# Patient Record
Sex: Female | Born: 1955 | Hispanic: Refuse to answer | Marital: Single
Health system: Southern US, Community
[De-identification: ages and names within clinical notes are randomized; demographics above are authoritative.]

---

## 2004-03-26 ENCOUNTER — Ambulatory Visit: Payer: Self-pay | Admitting: Family Medicine

## 2004-06-03 ENCOUNTER — Ambulatory Visit: Payer: Self-pay | Admitting: Family Medicine

## 2004-08-26 ENCOUNTER — Encounter: Payer: Self-pay | Admitting: Orthopaedic Surgery

## 2004-10-07 ENCOUNTER — Ambulatory Visit: Payer: Self-pay | Admitting: Neurosurgery

## 2004-11-12 ENCOUNTER — Ambulatory Visit: Payer: Self-pay | Admitting: Family Medicine

## 2004-11-20 ENCOUNTER — Ambulatory Visit: Payer: Self-pay | Admitting: Family Medicine

## 2005-02-06 ENCOUNTER — Ambulatory Visit: Payer: Self-pay

## 2007-01-10 ENCOUNTER — Ambulatory Visit: Payer: Self-pay | Admitting: Internal Medicine

## 2007-01-13 ENCOUNTER — Emergency Department: Payer: Self-pay | Admitting: Unknown Physician Specialty

## 2007-01-17 ENCOUNTER — Ambulatory Visit: Payer: Self-pay | Admitting: Internal Medicine

## 2007-01-21 ENCOUNTER — Ambulatory Visit: Payer: Self-pay | Admitting: Internal Medicine

## 2007-02-09 ENCOUNTER — Ambulatory Visit: Payer: Self-pay | Admitting: Unknown Physician Specialty

## 2007-02-09 ENCOUNTER — Ambulatory Visit: Payer: Self-pay | Admitting: Internal Medicine

## 2007-02-24 ENCOUNTER — Ambulatory Visit: Payer: Self-pay | Admitting: Unknown Physician Specialty

## 2007-02-25 ENCOUNTER — Inpatient Hospital Stay: Payer: Self-pay | Admitting: Surgery

## 2007-03-12 ENCOUNTER — Ambulatory Visit: Payer: Self-pay | Admitting: Internal Medicine

## 2007-03-15 ENCOUNTER — Ambulatory Visit: Payer: Self-pay | Admitting: Internal Medicine

## 2007-04-11 ENCOUNTER — Ambulatory Visit: Payer: Self-pay | Admitting: Internal Medicine

## 2007-06-07 ENCOUNTER — Ambulatory Visit: Payer: Self-pay | Admitting: Physician Assistant

## 2007-08-10 ENCOUNTER — Ambulatory Visit: Payer: Self-pay | Admitting: Internal Medicine

## 2007-08-18 ENCOUNTER — Ambulatory Visit: Payer: Self-pay | Admitting: Internal Medicine

## 2007-09-09 ENCOUNTER — Ambulatory Visit: Payer: Self-pay | Admitting: Internal Medicine

## 2007-09-29 ENCOUNTER — Ambulatory Visit: Payer: Self-pay | Admitting: Family Medicine

## 2008-03-28 ENCOUNTER — Ambulatory Visit: Payer: Self-pay

## 2008-07-28 IMAGING — PT NM PET TUM IMG INITIAL (PI) SKULL BASE T - THIGH
1 of 3 series · 1 of 25 positions shown · non-contrast
Comparison: none

REASON FOR EXAM: abdominal mass   hx Timur
COMMENTS:

PROCEDURE:     PET - PET/CT INIT STAGING LYMPHOMA  - January 21, 2007 [DATE]
RESULT:
HISTORY: Abdominal mass, Hodgkin's, recent abnormal CT.
PROCEDURE AND FINDINGS: Following determination of a fasting blood sugar of
95 mg/dl, 14.8 mCi of F-18/FDG was administered to the patient and whole
body PET CT was obtained.  No PET positive abnormalities are identified.

[Series 3: ct wb fusion · axial · 4.0mm · 0.98mm/px · 1 of 430 slices shown]
[im 430/430  brain]
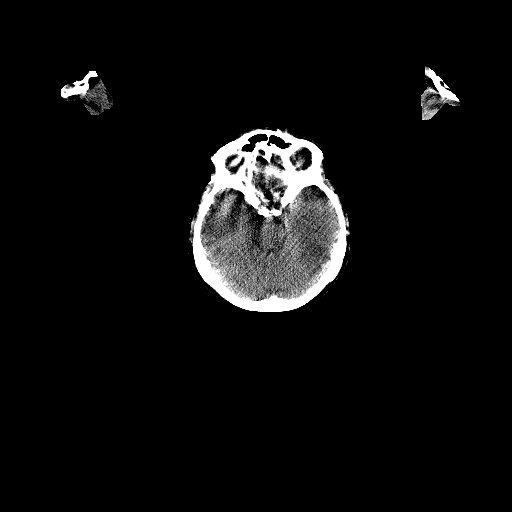

[1 of 25 positions shown; findings below may reference images not displayed]

IMPRESSION: 1)Normal exam. No PET positive abnormality is identified. Specifically, the
previously identified retroperitoneal mass is PET negative.

## 2008-11-08 ENCOUNTER — Ambulatory Visit: Payer: Self-pay | Admitting: Family Medicine

## 2008-11-21 ENCOUNTER — Ambulatory Visit: Payer: Self-pay | Admitting: Family Medicine

## 2009-03-21 ENCOUNTER — Ambulatory Visit: Payer: Self-pay | Admitting: Family Medicine

## 2009-06-05 ENCOUNTER — Ambulatory Visit: Payer: Self-pay | Admitting: Family Medicine

## 2009-12-27 ENCOUNTER — Ambulatory Visit: Payer: Self-pay | Admitting: Cardiovascular Disease

## 2009-12-27 ENCOUNTER — Observation Stay: Payer: Self-pay | Admitting: *Deleted

## 2010-01-03 ENCOUNTER — Ambulatory Visit: Payer: Self-pay | Admitting: Cardiovascular Disease

## 2010-04-29 ENCOUNTER — Ambulatory Visit: Payer: Self-pay | Admitting: Family Medicine

## 2010-06-02 ENCOUNTER — Ambulatory Visit: Payer: Self-pay | Admitting: Family Medicine

## 2012-08-20 ENCOUNTER — Emergency Department: Payer: Self-pay | Admitting: Emergency Medicine

## 2012-08-20 LAB — CBC
HCT: 49 % — ABNORMAL HIGH (ref 35.0–47.0)
HGB: 16.1 g/dL — ABNORMAL HIGH (ref 12.0–16.0)
MCHC: 32.8 g/dL (ref 32.0–36.0)
MCV: 95 fL (ref 80–100)
Platelet: 334 10*3/uL (ref 150–440)
RBC: 5.17 10*6/uL (ref 3.80–5.20)
RDW: 13.3 % (ref 11.5–14.5)
WBC: 19.2 10*3/uL — ABNORMAL HIGH (ref 3.6–11.0)

## 2012-08-20 LAB — BASIC METABOLIC PANEL
Anion Gap: 6 — ABNORMAL LOW (ref 7–16)
BUN: 20 mg/dL — ABNORMAL HIGH (ref 7–18)
Chloride: 100 mmol/L (ref 98–107)
Co2: 28 mmol/L (ref 21–32)
Creatinine: 0.97 mg/dL (ref 0.60–1.30)
EGFR (African American): >60
Potassium: 3.9 mmol/L (ref 3.5–5.1)
Sodium: 134 mmol/L — ABNORMAL LOW (ref 136–145)

## 2012-09-05 ENCOUNTER — Ambulatory Visit: Payer: Self-pay | Admitting: Internal Medicine

## 2012-11-24 ENCOUNTER — Emergency Department: Payer: Self-pay

## 2012-11-24 LAB — CBC
HCT: 28.6 % — ABNORMAL LOW (ref 35.0–47.0)
HGB: 9.3 g/dL — ABNORMAL LOW (ref 12.0–16.0)
Platelet: 382 10*3/uL (ref 150–440)

## 2012-11-24 LAB — COMPREHENSIVE METABOLIC PANEL
Albumin: 2.9 g/dL — ABNORMAL LOW (ref 3.4–5.0)
Alkaline Phosphatase: 201 U/L — ABNORMAL HIGH (ref 50–136)
Bilirubin,Total: 0.5 mg/dL (ref 0.2–1.0)
Chloride: 107 mmol/L (ref 98–107)
Co2: 30 mmol/L (ref 21–32)
Creatinine: 0.83 mg/dL (ref 0.60–1.30)
EGFR (African American): >60
Sodium: 143 mmol/L (ref 136–145)
Total Protein: 6.1 g/dL — ABNORMAL LOW (ref 6.4–8.2)

## 2014-01-09 DEATH — deceased

## 2014-06-01 IMAGING — CT CT CERVICAL SPINE WITHOUT CONTRAST
1 series · 12 of 14 positions shown, 15 images · non-contrast
Comparison: none

REASON FOR EXAM: fall; unable to clear cspine
COMMENTS:

[Series 5: axial · axial · 0.20mm/px · z∈[-238,-85]mm · 12 of 93 slices shown, 15 images]
[im 8/93  soft-tissue]
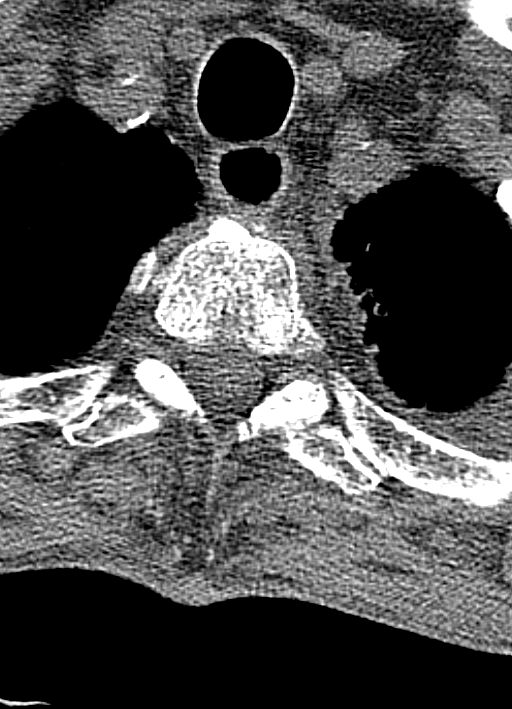
[im 8/93  bone]
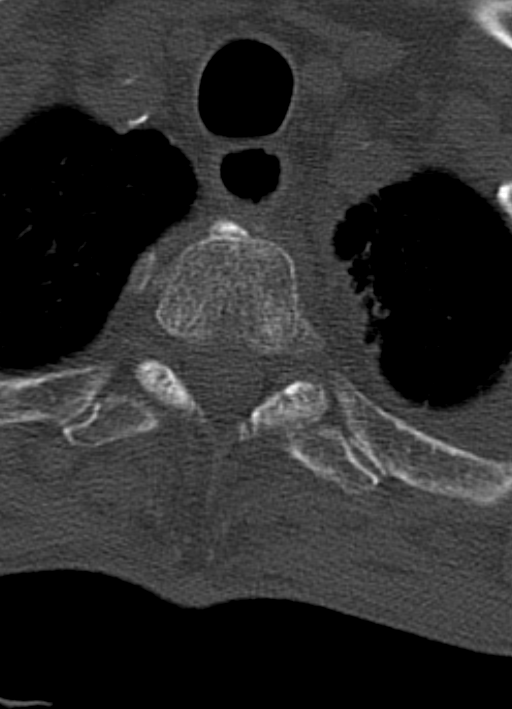
[im 15/93  bone]
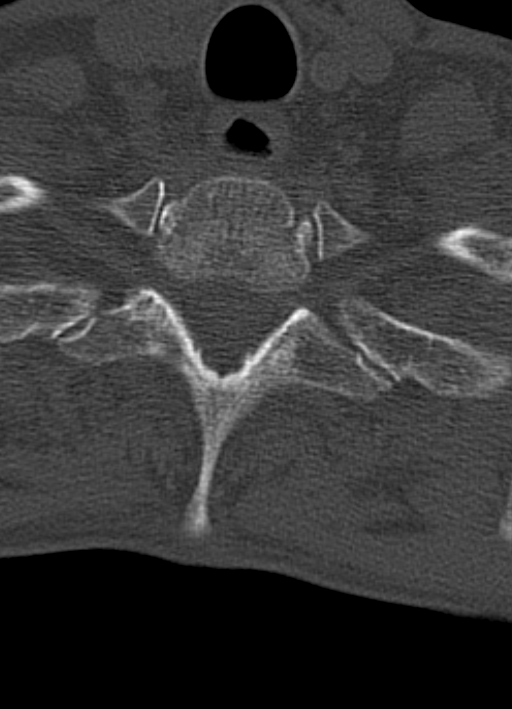
[im 22/93  bone]
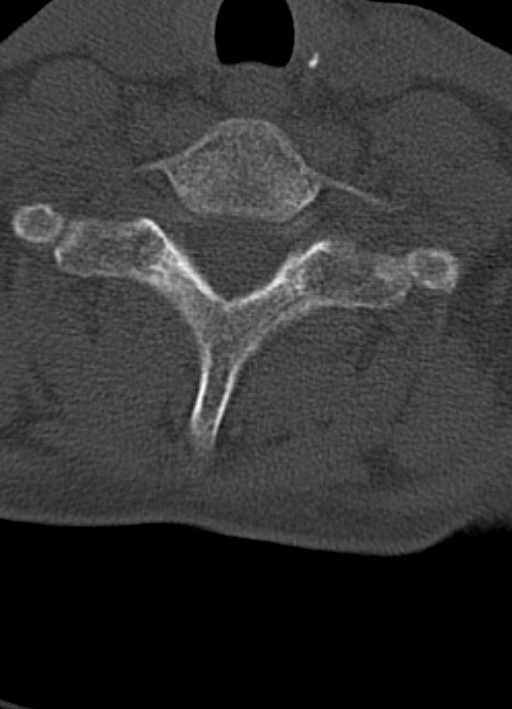
[im 29/93  bone]
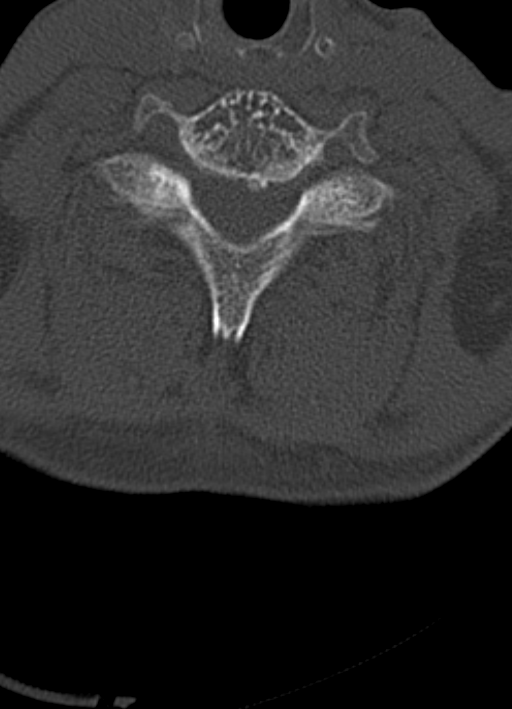
[im 36/93  soft-tissue]
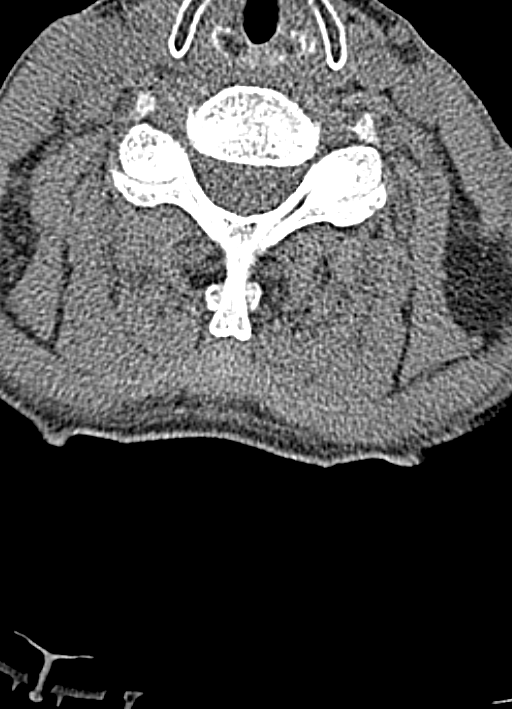
[im 36/93  bone]
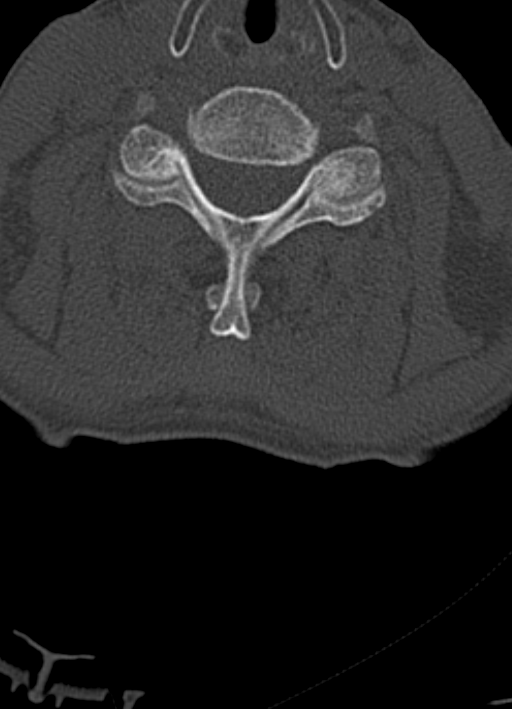
[im 43/93  bone]
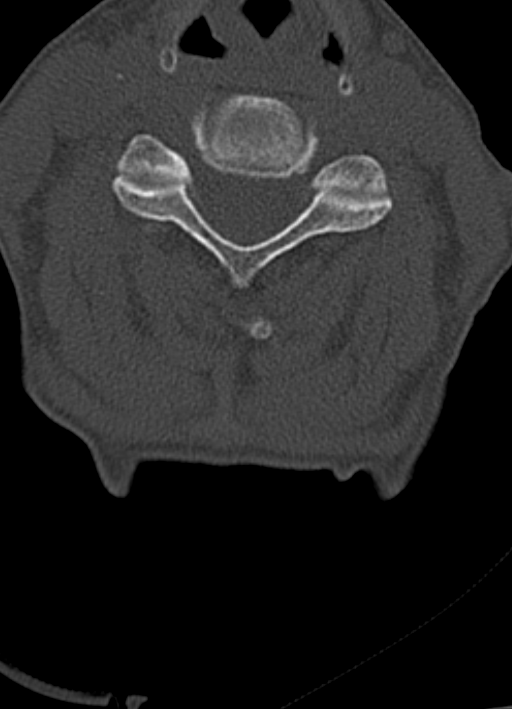
[im 50/93  bone]
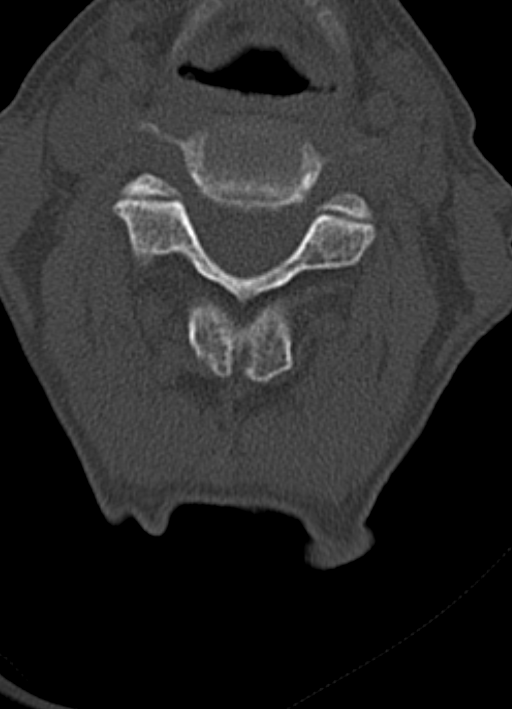
[im 57/93  bone]
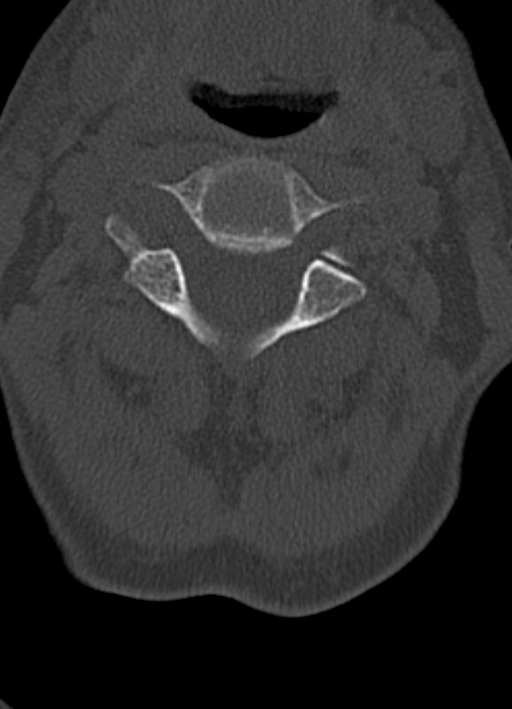
[im 64/93  soft-tissue]
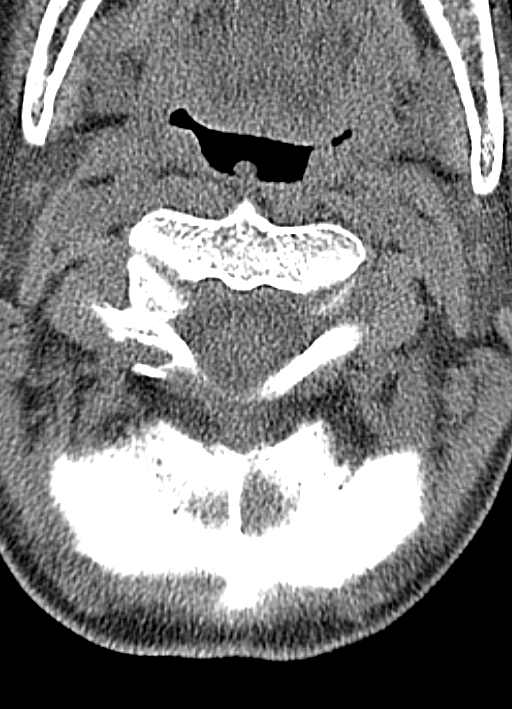
[im 64/93  bone]
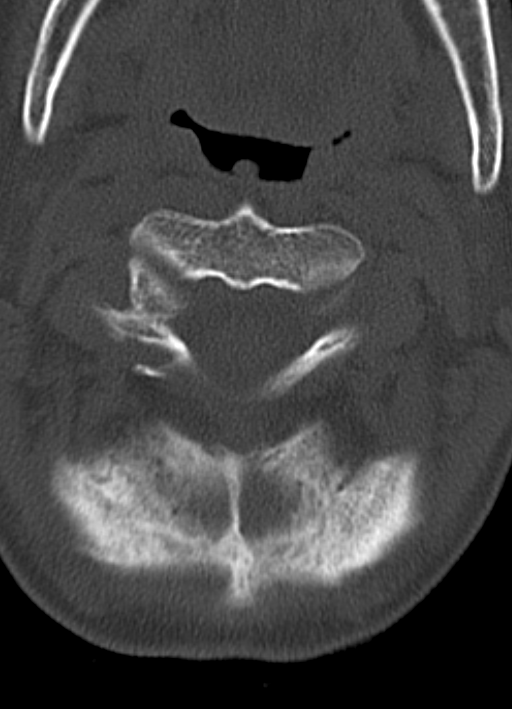
[im 71/93  bone]
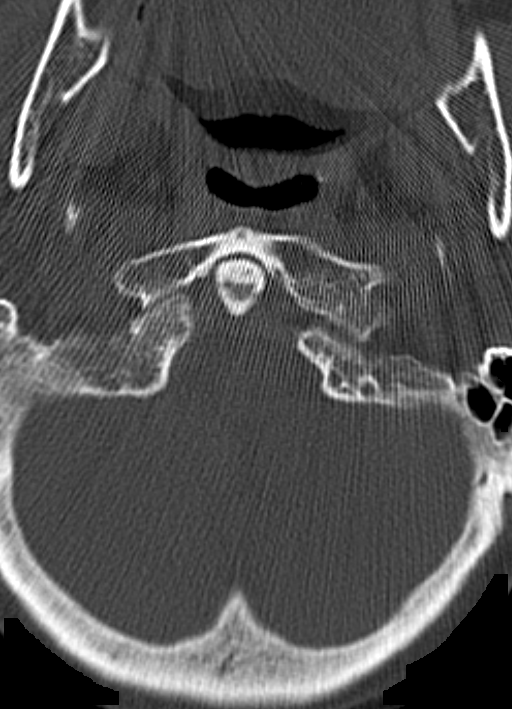
[im 78/93  bone]
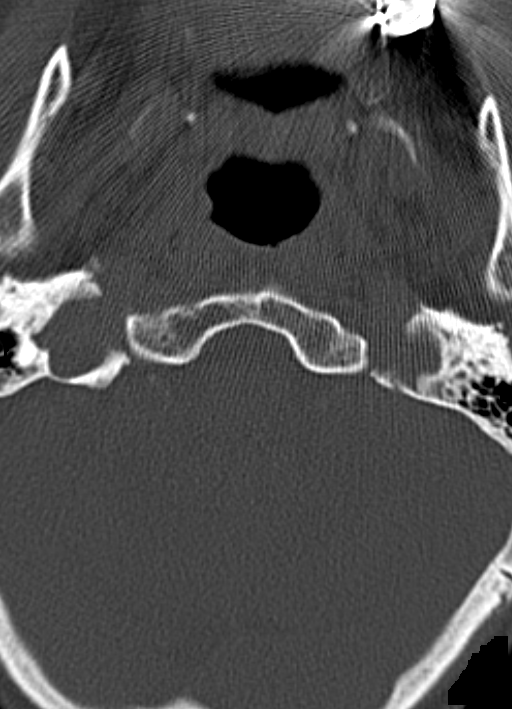
[im 85/93  bone]
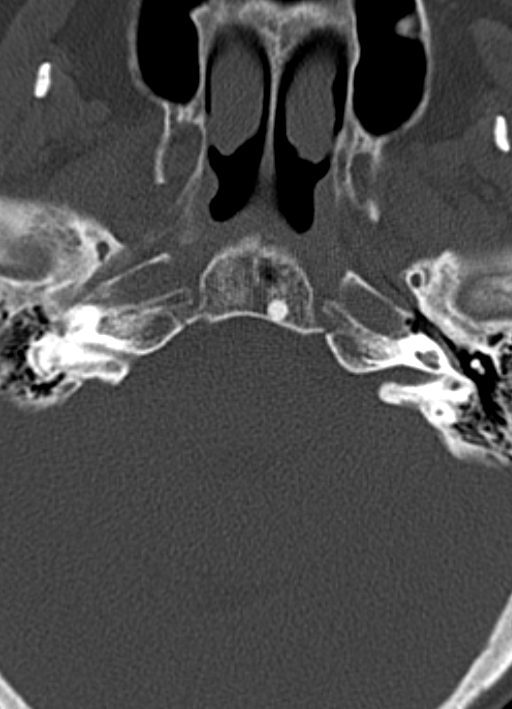

[12 of 14 positions shown; findings below may reference images not displayed]

PROCEDURE:     CT  - CT CERVICAL SPINE WO  - November 24, 2012  [DATE]

RESULT:     Noncontrast CT of the brain is performed in the standard fashion
with multislice helical acquisition reconstructed at 2.0 mm slice thickness
in the axial, coronal and sagittal planes at bone window settings. The
patient has no previous exam for comparison.

The spinal alignment appears to be maintained. The prevertebral soft tissues
are normal. The craniocervical junction and atlantoaxial alignment appear
normal.
IMPRESSION: No acute cervical spine bony abnormality.

[REDACTED]

## 2014-08-31 NOTE — Consult Note (Signed)
PATIENT NAME:  Anna Lee, Anna L MR#:  [id number] DATE OF BIRTH:  Oct 23, 1955  DATE OF CONSULTATION:  08/20/2012  CONSULTING PHYSICIAN:  Laurier NancyShaukat A. Sameul Tagle, MD  HISTORY OF PRESENT ILLNESS: This is a 59 year old white female with a past medical history of diabetes, hypertension, hyperlipidemia, normally followed by Dr. Thana AtesMorrisey, who has been having pressure-type chest pain since Thursday. She thought she was having tightness due to asthma, so thus did not come to the hospital. I was asked to evaluate the patient because the patient is having ST elevation. She is still having pressure in the chest, about 4/10 chest pain, described as pressure-type, with shortness of breath and dizziness and diaphoresis.   PAST MEDICAL HISTORY: As mentioned, history of diabetes, hypertension, hypercholesterolemia. She also has a history of asthma.   FAMILY HISTORY: Positive for coronary artery disease.   ALLERGIES: None.   PHYSICAL EXAMINATION:  GENERAL: Her blood pressure 110/78, respirations are 20 and pulse is 90. She is afebrile.  NECK: Positive JVD.  LUNGS: Good air entry. No wheezing, no rales.  HEART: Regular rate and rhythm. Normal S1, S2. A 2/6 systolic murmur at the mitral area.  ABDOMEN: Soft, nontender. Positive bowel sounds.  EXTREMITIES: No pedal edema.  NEUROLOGIC: She appears to be intact.   DIAGNOSTIC STUDIES: EKG shows sinus rhythm, 90 beats per minute, right bundle branch block with 3 mm anterior ST elevation in V1 to V3, associated with a deep Q wave in the anterior leads, suggestive of probably already completed anteroseptal MI. No old EKG is available.   ASSESSMENT AND PLAN: Anteroseptal ST elevation myocardial infarction. [facility] After discharge, will follow up the patient.   ____________________________ Laurier NancyShaukat A. Karlynn Furrow, MD sak:OSi D: 08/20/2012 13:50:44 ET T: 08/20/2012 14:01:24 ET JOB#: [id number]  cc: Laurier NancyShaukat A. Caprice Wasko, MD, <Dictator> [place]  Morrisey, MD Laurier NancySHAUKAT A Saloma Cadena MD ELECTRONICALLY SIGNED 08/22/2012 8:54
